# Patient Record
Sex: Male | Born: 1990 | Race: White | Hispanic: No | Marital: Single | Smoking: Never smoker
Health system: Southern US, Community
[De-identification: ages and names within clinical notes are randomized; demographics above are authoritative.]

## PROBLEM LIST (undated history)

## (undated) DIAGNOSIS — F0781 Postconcussional syndrome: Secondary | ICD-10-CM

## (undated) DIAGNOSIS — F419 Anxiety disorder, unspecified: Secondary | ICD-10-CM

## (undated) DIAGNOSIS — F32A Depression, unspecified: Secondary | ICD-10-CM

## (undated) HISTORY — DX: Postconcussional syndrome: F07.81

## (undated) HISTORY — DX: Anxiety disorder, unspecified: F41.9

## (undated) HISTORY — DX: Depression, unspecified: F32.A

## (undated) HISTORY — PX: WISDOM TOOTH EXTRACTION: SHX21

---

## 2018-01-06 ENCOUNTER — Ambulatory Visit: Payer: Self-pay | Admitting: Osteopathic Medicine

## 2018-01-06 ENCOUNTER — Telehealth: Payer: Self-pay | Admitting: Osteopathic Medicine

## 2018-01-06 NOTE — Telephone Encounter (Signed)
No-show to establish care with Dr. Lyn HollingsheadAlexander, 01/06/18. If late or no-show again, will not accept patient to this clinic. Please call him to reschedule visit and inform them of this policy.

## 2018-01-07 NOTE — Telephone Encounter (Signed)
Printed for scheduler

## 2019-08-08 ENCOUNTER — Ambulatory Visit (INDEPENDENT_AMBULATORY_CARE_PROVIDER_SITE_OTHER): Payer: Self-pay | Admitting: Otolaryngology

## 2019-08-15 ENCOUNTER — Encounter (INDEPENDENT_AMBULATORY_CARE_PROVIDER_SITE_OTHER): Payer: Self-pay | Admitting: Otolaryngology

## 2019-08-15 ENCOUNTER — Ambulatory Visit (INDEPENDENT_AMBULATORY_CARE_PROVIDER_SITE_OTHER): Payer: 59 | Admitting: Otolaryngology

## 2019-08-15 ENCOUNTER — Other Ambulatory Visit: Payer: Self-pay

## 2019-08-15 VITALS — Temp 97.9°F

## 2019-08-15 DIAGNOSIS — J342 Deviated nasal septum: Secondary | ICD-10-CM | POA: Diagnosis not present

## 2019-08-15 DIAGNOSIS — J310 Chronic rhinitis: Secondary | ICD-10-CM

## 2019-08-15 NOTE — Progress Notes (Signed)
HPI: Mason Cabrera is a 29 y.o. male who presents for evaluation of nasal congestion and postnasal drainage.  He complains of a lot of mucus especially in the mornings.  He has to cough frequently because of the excessive mucus.  He was told years ago that he has a septal deviation that is contributing to his nasal obstruction.  He has been using Flonase. Complains mostly of postnasal drainage and cough.  He has nasal congestion which is worse on the left side.  He does have history of allergies. He takes medications for anxiety and depression but is otherwise healthy. He has a questionable allergy to Augmentin as a child.  No past medical history on file.  Social History   Socioeconomic History  . Marital status: Single    Spouse name: Not on file  . Number of children: Not on file  . Years of education: Not on file  . Highest education level: Not on file  Occupational History  . Not on file  Tobacco Use  . Smoking status: Never Smoker  . Smokeless tobacco: Never Used  Substance and Sexual Activity  . Alcohol use: Not on file  . Drug use: Not on file  . Sexual activity: Not on file  Other Topics Concern  . Not on file  Social History Narrative  . Not on file   Social Determinants of Health   Financial Resource Strain:   . Difficulty of Paying Living Expenses: Not on file  Food Insecurity:   . Worried About Programme researcher, broadcasting/film/video in the Last Year: Not on file  . Ran Out of Food in the Last Year: Not on file  Transportation Needs:   . Lack of Transportation (Medical): Not on file  . Lack of Transportation (Non-Medical): Not on file  Physical Activity:   . Days of Exercise per Week: Not on file  . Minutes of Exercise per Session: Not on file  Stress:   . Feeling of Stress : Not on file  Social Connections:   . Frequency of Communication with Friends and Family: Not on file  . Frequency of Social Gatherings with Friends and Family: Not on file  . Attends Religious  Services: Not on file  . Active Member of Clubs or Organizations: Not on file  . Attends Banker Meetings: Not on file  . Marital Status: Not on file   No family history on file. Not on File Prior to Admission medications   Not on File     Positive ROS: Otherwise negative  All other systems have been reviewed and were otherwise negative with the exception of those mentioned in the HPI and as above.  Physical Exam: Constitutional: Alert, well-appearing, no acute distress Ears: External ears without lesions or tenderness. Ear canals are clear bilaterally with intact, clear TMs.  Nasal: External nose without lesions. Septum is deviated to the left anteriorly inferiorly..  Moderate rhinitis.  Both middle meatus regions are clear with no signs of infection.  No polyps noted.  Minimal clear mucus within the nasal cavity. Oral: Lips and gums without lesions. Tongue and palate mucosa without lesions. Posterior oropharynx clear.  Indirect laryngoscopy revealed a clear hypopharynx and larynx. Neck: No palpable adenopathy or masses Respiratory: Breathing comfortably.  Lungs clear to auscultation Cardiac exam: Regular rate and rhythm without murmur. Skin: No facial/neck lesions or rash noted.  Procedures  Assessment: Chronic rhinitis Septal deviation with moderate turbinate partially  Plan: I discussed with him that he would  probably benefit from septoplasty and turbinate reductions as far as breathing easier as well as reducing postnasal drainage. In the meantime reviewed with him concerning medical treatment with use of either Flonase or Nasacort and gave a prescription to try Nasacort to compare the Flonase.  Also gave him some samples of Mucinex DM to try as well as regular Mucinex.  Also gave him a sample of Xyzal.  Suggested antihistamines would also help with some postnasal drainage. He will call us back when he decides to schedule septoplasty and TURB reductions.  Radene Journey, MD

## 2020-02-06 DIAGNOSIS — M549 Dorsalgia, unspecified: Secondary | ICD-10-CM | POA: Insufficient documentation

## 2020-02-26 ENCOUNTER — Ambulatory Visit (INDEPENDENT_AMBULATORY_CARE_PROVIDER_SITE_OTHER): Payer: 59 | Admitting: Family Medicine

## 2020-02-26 ENCOUNTER — Encounter: Payer: Self-pay | Admitting: Family Medicine

## 2020-02-26 ENCOUNTER — Other Ambulatory Visit: Payer: Self-pay

## 2020-02-26 VITALS — Ht 67.0 in | Wt 175.0 lb

## 2020-02-26 DIAGNOSIS — F321 Major depressive disorder, single episode, moderate: Secondary | ICD-10-CM | POA: Diagnosis not present

## 2020-02-26 DIAGNOSIS — T796XXA Traumatic ischemia of muscle, initial encounter: Secondary | ICD-10-CM | POA: Diagnosis not present

## 2020-02-26 DIAGNOSIS — R569 Unspecified convulsions: Secondary | ICD-10-CM

## 2020-02-26 DIAGNOSIS — F411 Generalized anxiety disorder: Secondary | ICD-10-CM
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
## 2020-02-26 NOTE — Assessment & Plan Note (Signed)
Management of depression and anxiety per psychiatry.   Stable with current medications.

## 2020-02-26 NOTE — Assessment & Plan Note (Signed)
Questionable seizure causing fall while in the hospital.  Continues on depakene.   He is having some continued memory issues as well, likely related to concussion.  Referral placed to neurology.

## 2020-02-26 NOTE — Progress Notes (Signed)
Symptoms:  Feverish - no fever, feels feverish Cough - 1 day (resolved)
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
## 2020-02-27 LAB — COMPLETE METABOLIC PANEL WITH GFR
AG Ratio: 1.6 (calc) (ref 1.0–2.5)
ALT: 33 U/L (ref 9–46)
AST: 23 U/L (ref 10–40)
Albumin: 3.9 g/dL (ref 3.6–5.1)
Alkaline phosphatase (APISO): 97 U/L (ref 36–130)
BUN: 9 mg/dL (ref 7–25)
CO2: 25 mmol/L (ref 20–32)
Calcium: 8.8 mg/dL (ref 8.6–10.3)
Chloride: 107 mmol/L (ref 98–110)
Creat: 1.19 mg/dL (ref 0.60–1.35)
GFR, Est African American: 94 mL/min/{1.73_m2} (ref >=60)
GFR, Est Non African American: 81 mL/min/{1.73_m2} (ref >=60)
Globulin: 2.4 g/dL (calc) (ref 1.9–3.7)
Glucose, Bld: 120 mg/dL (ref 65–139)
Potassium: 3.7 mmol/L (ref 3.5–5.3)
Sodium: 140 mmol/L (ref 135–146)
Total Bilirubin: 0.3 mg/dL (ref 0.2–1.2)
Total Protein: 6.3 g/dL (ref 6.1–8.1)

## 2020-02-27 LAB — CBC
HCT: 43.5 % (ref 38.5–50.0)
Hemoglobin: 15 g/dL (ref 13.2–17.1)
MCH: 31.7 pg (ref 27.0–33.0)
MCHC: 34.5 g/dL (ref 32.0–36.0)
MCV: 92 fL (ref 80.0–100.0)
MPV: 10 fL (ref 7.5–12.5)
Platelets: 387 10*3/uL (ref 140–400)
RBC: 4.73 10*6/uL (ref 4.20–5.80)
RDW: 12.7 % (ref 11.0–15.0)
WBC: 9.6 10*3/uL (ref 3.8–10.8)

## 2020-02-27 LAB — CK: Total CK: 76 U/L (ref 44–196)

## 2020-03-01 ENCOUNTER — Other Ambulatory Visit: Payer: Self-pay | Admitting: Family Medicine

## 2020-03-01 ENCOUNTER — Encounter: Payer: Self-pay | Admitting: Family Medicine

## 2020-03-05 ENCOUNTER — Encounter: Payer: Self-pay | Admitting: Neurology

## 2020-03-05 ENCOUNTER — Other Ambulatory Visit: Payer: Self-pay

## 2020-03-05 ENCOUNTER — Ambulatory Visit (INDEPENDENT_AMBULATORY_CARE_PROVIDER_SITE_OTHER): Payer: 59 | Admitting: Neurology

## 2020-03-05 VITALS — BP 122/76 | HR 79 | Ht 67.0 in | Wt 187.0 lb

## 2020-03-05 DIAGNOSIS — S069XAA Unspecified intracranial injury with loss of consciousness status unknown, initial encounter: Secondary | ICD-10-CM | POA: Insufficient documentation

## 2020-03-05 DIAGNOSIS — S069X1A Unspecified intracranial injury with loss of consciousness of 30 minutes or less, initial encounter: Secondary | ICD-10-CM | POA: Diagnosis not present

## 2020-03-05 DIAGNOSIS — T796XXA Traumatic ischemia of muscle, initial encounter: Secondary | ICD-10-CM | POA: Diagnosis not present

## 2020-03-05 DIAGNOSIS — F609 Personality disorder, unspecified: Secondary | ICD-10-CM | POA: Diagnosis not present

## 2020-03-05 DIAGNOSIS — R412 Retrograde amnesia: Secondary | ICD-10-CM | POA: Diagnosis not present

## 2020-03-05 DIAGNOSIS — F0781 Postconcussional syndrome: Secondary | ICD-10-CM
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
## 2020-03-05 NOTE — Patient Instructions (Signed)
Traumatic Brain Injury Traumatic brain injury (TBI) is an injury to the brain that results from:  A hard, direct blow to the head (closed injury).  An object penetrating the skull and entering the brain (open injury). Traumatic brain injury is also called a head injury or a concussion. TBI can be mild, moderate, or severe. What are the causes? Common causes of this condition include:  Falls.  Motor vehicle accidents.  Sports injuries.  Assaults. What increases the risk? You are more likely to develop this condition if you:  Are 75 years old or older.  Are a man.  Play contact sports, especially football, hockey, or soccer.  Are in the military.  Are a victim of violence.  Abuse drugs or alcohol.  Have had a previous TBI. What are the signs or symptoms? Symptoms may vary from person to person, and may include:  Loss of consciousness.  Headache.  Confusion.  Fatigue.  Changes in sleep.  Dizziness.  Mood or personality changes.  Memory problems.  Nausea or vomiting or both.  Seizures.  Clumsiness.  Slurred speech.  Depression and anxiety.  Anger.  Trouble concentrating, organizing, or making decisions.  Inability to control emotions or actions (impulse control).  Loss of or dulling of the senses, such as hearing, vision, and touch. This can include: ? Blurred vision. ? Ringing in your ears. How is this diagnosed? This condition may be diagnosed based on:  Medical history and physical exam.  Neurologic exam. This checks for brain and nervous system function, including your reflexes, memory, and coordination.  CT scan. Your TBI may be described as mild, moderate, or severe. How is this treated? Treatment depends on the severity of your brain injury and may include:  Breathing support (mechanical ventilation).  Blood pressure medicines.  Pain medicines.  Treatments to decrease the swelling in your brain.  Brain surgery. This may be  needed to: ? Remove a blood clot. ? Repair bleeding. ? Remove an object that has penetrated the brain, such as a skull fragment or a bullet. Treatment of TBI also includes:  Physical and mental rest.  Careful observation.  Medicine. You may be prescribed medicines to help with symptoms such as headaches, nausea, or difficulty sleeping.  Physical, occupational, and speech therapy.  Referral to a concussion clinic or rehabilitation center. Follow these instructions at home: Medicines  Take over-the-counter and prescription medicines only as told by your health care provider.  Do not take blood thinners (anticoagulants), aspirin, or other anti-inflammatory medicines such as ibuprofen or naproxen unless approved by your health care provider. Activity  Rest. Rest helps the brain to heal. Make sure you: ? Get plenty of sleep. Most adults should get 7-9 hours of sleep each night. ? Rest during the day. Take daytime naps or rest breaks when you feel tired.  Do not do high-risk activities that could cause a second concussion, such as riding a bike or playing sports. Having another concussion before the first one has healed can be dangerous.  Avoid a lot of visual stimulation. This includes work on the computer or phone, watching TV, and reading.  Ask your health care provider what kind of activities are safe for you. Your ability to react may be slower after a brain injury. Never do these activities if you are dizzy. Your health care provider will likely give you a plan for gradually returning to activities. General instructions  Do not drink alcohol.  Watch your symptoms and tell others to do the   same. Complications sometimes occur after a brain injury. Older adults with a brain injury may have a higher risk of serious complications.  Seek support from friends and family.  Keep all follow-up visits as directed by your health care provider. This is important. Contact a health care  provider if:  Your symptoms get worse or they do not improve.  You have new symptoms.  You have another injury. Get help right away if:  You have: ? Severe, persistent headaches that are not relieved by medicine. ? Weakness or numbness in any part of your body. ? Confusion. ? Slurred speech. ? Difficulty waking up. ? Nausea or persistent vomiting. ? A feeling like you are moving when you are not (vertigo). ? Seizures or you faint. ? Changes in your vision. ? Clear or bloody discharge from your nose or ears.  You cannot use your arms or legs normally. Summary  Traumatic brain injury happens when there is a hard, direct blow to the head or when an object penetrates the skull and enters the brain.  Traumatic brain injuries may be mild, moderate, or severe. Treatment depends on the severity of your injury.  Get help right away if you have a head injury and you develop seizures, confusion, vomiting, weakness in the arms or legs, slurred speech, and other symptoms.  Rest is one of the best treatments. Do not return to activity until your health care provider approves. This information is not intended to replace advice given to you by your health care provider. Make sure you discuss any questions you have with your health care provider. Document Revised: 09/29/2017 Document Reviewed: 07/12/2017 Elsevier Patient Education  2020 [employer]vier Inc[employer]  

## 2020-03-06 ENCOUNTER — Encounter: Payer: Self-pay | Admitting: Family Medicine

## 2020-03-06 DIAGNOSIS — M545 Low back pain, unspecified: Secondary | ICD-10-CM

## 2020-03-06 NOTE — Telephone Encounter (Signed)
Orthopedics referral pended

## 2020-03-07 NOTE — Telephone Encounter (Signed)
Referral completed

## 2020-03-11 ENCOUNTER — Ambulatory Visit (INDEPENDENT_AMBULATORY_CARE_PROVIDER_SITE_OTHER): Payer: 59 | Admitting: Family Medicine

## 2020-03-11 ENCOUNTER — Other Ambulatory Visit: Payer: Self-pay

## 2020-03-11 ENCOUNTER — Encounter: Payer: Self-pay | Admitting: Family Medicine

## 2020-03-11 DIAGNOSIS — M545 Low back pain, unspecified: Secondary | ICD-10-CM | POA: Diagnosis not present

## 2020-03-11 DIAGNOSIS — S060X9S Concussion with loss of consciousness of unspecified duration, sequela: Secondary | ICD-10-CM | POA: Diagnosis not present

## 2020-03-11 MED ORDER — BACLOFEN 10 MG PO TABS: 5.0000 mg | ORAL_TABLET | Freq: Three times a day (TID) | ORAL | 3 refills | Status: DC | PRN
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
## 2020-03-13 ENCOUNTER — Encounter: Payer: Self-pay | Admitting: Family Medicine

## 2020-03-16 MED ORDER — TIZANIDINE HCL 2 MG PO TABS: 2.0000 mg | ORAL_TABLET | Freq: Four times a day (QID) | ORAL | 1 refills | Status: DC | PRN

## 2020-03-16 NOTE — Addendum Note (Signed)
Addended by: Lillia Carmel on: 03/16/2020 08:07 PM   Modules accepted: Orders

## 2020-03-19 ENCOUNTER — Encounter: Payer: Self-pay | Admitting: Family Medicine

## 2020-03-19 DIAGNOSIS — M545 Low back pain, unspecified: Secondary | ICD-10-CM

## 2020-04-03 ENCOUNTER — Encounter: Payer: Self-pay | Admitting: Family Medicine

## 2020-04-03 DIAGNOSIS — M545 Low back pain, unspecified: Secondary | ICD-10-CM

## 2020-04-08 ENCOUNTER — Encounter: Payer: 59 | Admitting: Family Medicine

## 2020-04-08 ENCOUNTER — Other Ambulatory Visit: Payer: Self-pay | Admitting: Family Medicine

## 2020-04-25 ENCOUNTER — Other Ambulatory Visit: Payer: 59

## 2020-05-03 ENCOUNTER — Other Ambulatory Visit: Payer: Self-pay | Admitting: Family Medicine

## 2020-06-05 ENCOUNTER — Ambulatory Visit
Admission: RE | Admit: 2020-06-05 | Discharge: 2020-06-05 | Disposition: A | Discharge status: Home / Self Care | Payer: 59 | Source: Ambulatory Visit | Attending: Family Medicine | Admitting: Family Medicine

## 2020-06-05 ENCOUNTER — Other Ambulatory Visit: Payer: Self-pay

## 2020-06-05 DIAGNOSIS — M545 Low back pain, unspecified: Secondary | ICD-10-CM

## 2020-06-06 ENCOUNTER — Telehealth: Payer: Self-pay | Admitting: Family Medicine

## 2020-06-06 NOTE — Telephone Encounter (Signed)
Lumbar MRI looks normal.   

## 2020-06-12 ENCOUNTER — Ambulatory Visit (INDEPENDENT_AMBULATORY_CARE_PROVIDER_SITE_OTHER): Payer: 59 | Admitting: Family Medicine

## 2020-06-12 DIAGNOSIS — M545 Low back pain, unspecified: Secondary | ICD-10-CM

## 2020-06-12 NOTE — Progress Notes (Signed)
I saw and examined the patient with Dr. Marga Hoots and agree with assessment and plan as outlined.    Pain is better with PT, but not where he needs to be.   Exam reveals tender trigger points in the upper lumbar paraspinals today.  Will refer to Dr. Berline Chough for OMT treatments.  If not improved with that, then dry needling in PT.
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
## 2020-06-12 NOTE — Patient Instructions (Signed)
    Gaspar Bidding, DO  [email or web address]  ADDRESS [address] [place], [place] [address] CONTACT INFO [email or web address] [phone]

## 2020-07-10 ENCOUNTER — Other Ambulatory Visit: Payer: Self-pay | Admitting: Family Medicine

## 2020-08-23 ENCOUNTER — Ambulatory Visit (INDEPENDENT_AMBULATORY_CARE_PROVIDER_SITE_OTHER): Payer: 59 | Admitting: Family Medicine

## 2020-08-23 ENCOUNTER — Other Ambulatory Visit: Payer: Self-pay

## 2020-08-23 ENCOUNTER — Encounter: Payer: Self-pay | Admitting: Family Medicine

## 2020-08-23 VITALS — BP 118/66 | HR 61 | Temp 97.5°F | Wt 192.5 lb

## 2020-08-23 DIAGNOSIS — R630 Anorexia: Secondary | ICD-10-CM | POA: Diagnosis not present

## 2020-08-23 DIAGNOSIS — R5383 Other fatigue: Secondary | ICD-10-CM | POA: Diagnosis not present

## 2020-08-23 MED ORDER — TRIAMCINOLONE ACETONIDE 55 MCG/ACT NA AERO: INHALATION_SPRAY | NASAL | 3 refills | Status: DC

## 2020-08-25 DIAGNOSIS — R5383 Other fatigue: Secondary | ICD-10-CM | POA: Insufficient documentation
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
## 2020-08-25 NOTE — Assessment & Plan Note (Addendum)
Based on previous symptoms the most likely cause is post viral fatigue.  I have ordered additional labs for evaluation of fatigue as well.  He denies any recent medication changes. Orders Placed This Encounter  Procedures  . COMPLETE METABOLIC PANEL WITH GFR  . CBC with Differential  . TSH  . B12  . HIV antibody (with reflex)

## 2020-08-27 LAB — COMPLETE METABOLIC PANEL WITH GFR
AG Ratio: 1.8 (calc) (ref 1.0–2.5)
ALT: 26 U/L (ref 9–46)
AST: 18 U/L (ref 10–40)
Albumin: 4.6 g/dL (ref 3.6–5.1)
Alkaline phosphatase (APISO): 74 U/L (ref 36–130)
BUN: 15 mg/dL (ref 7–25)
CO2: 22 mmol/L (ref 20–32)
Calcium: 9.8 mg/dL (ref 8.6–10.3)
Chloride: 106 mmol/L (ref 98–110)
Creat: 1.27 mg/dL (ref 0.60–1.35)
GFR, Est African American: 86 mL/min/{1.73_m2} (ref >=60)
GFR, Est Non African American: 74 mL/min/{1.73_m2} (ref >=60)
Globulin: 2.6 g/dL (calc) (ref 1.9–3.7)
Glucose, Bld: 95 mg/dL (ref 65–139)
Potassium: 4.1 mmol/L (ref 3.5–5.3)
Sodium: 138 mmol/L (ref 135–146)
Total Bilirubin: 0.8 mg/dL (ref 0.2–1.2)
Total Protein: 7.2 g/dL (ref 6.1–8.1)

## 2020-08-27 LAB — CBC WITH DIFFERENTIAL/PLATELET
Absolute Monocytes: 679 cells/uL (ref 200–950)
Basophils Absolute: 52 cells/uL (ref 0–200)
Basophils Relative: 0.6 %
Eosinophils Absolute: 172 cells/uL (ref 15–500)
Eosinophils Relative: 2 %
HCT: 44.8 % (ref 38.5–50.0)
Hemoglobin: 15.6 g/dL (ref 13.2–17.1)
Lymphs Abs: 2348 cells/uL (ref 850–3900)
MCH: 31.1 pg (ref 27.0–33.0)
MCHC: 34.8 g/dL (ref 32.0–36.0)
MCV: 89.4 fL (ref 80.0–100.0)
MPV: 9.8 fL (ref 7.5–12.5)
Monocytes Relative: 7.9 %
Neutro Abs: 5349 cells/uL (ref 1500–7800)
Neutrophils Relative %: 62.2 %
Platelets: 341 10*3/uL (ref 140–400)
RBC: 5.01 10*6/uL (ref 4.20–5.80)
RDW: 13.1 % (ref 11.0–15.0)
Total Lymphocyte: 27.3 %
WBC: 8.6 10*3/uL (ref 3.8–10.8)

## 2020-08-27 LAB — VITAMIN B12: Vitamin B-12: 743 pg/mL (ref 200–1100)

## 2020-08-27 LAB — HIV ANTIBODY (ROUTINE TESTING W REFLEX): HIV 1&2 Ab, 4th Generation: NONREACTIVE

## 2020-08-27 LAB — TSH: TSH: 1.18 mIU/L (ref 0.40–4.50)

## 2021-01-14 ENCOUNTER — Other Ambulatory Visit: Payer: Self-pay | Admitting: Family Medicine

## 2021-02-06 ENCOUNTER — Other Ambulatory Visit: Payer: Self-pay

## 2021-02-06 ENCOUNTER — Ambulatory Visit (INDEPENDENT_AMBULATORY_CARE_PROVIDER_SITE_OTHER): Payer: 59 | Admitting: Family Medicine

## 2021-02-06 ENCOUNTER — Encounter: Payer: Self-pay | Admitting: Family Medicine

## 2021-02-06 VITALS — BP 127/77 | HR 58 | Temp 97.7°F | Ht 67.0 in | Wt 192.9 lb

## 2021-02-06 DIAGNOSIS — F321 Major depressive disorder, single episode, moderate: Secondary | ICD-10-CM | POA: Diagnosis not present

## 2021-02-06 DIAGNOSIS — R5383 Other fatigue: Secondary | ICD-10-CM

## 2021-02-06 DIAGNOSIS — Z23 Encounter for immunization: Secondary | ICD-10-CM

## 2021-02-06 NOTE — Assessment & Plan Note (Signed)
Rechecking renal function today.  This had improved on check last year.

## 2021-02-06 NOTE — Assessment & Plan Note (Signed)
Managed by psychiatry, stable at this time.
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
## 2021-03-10 ENCOUNTER — Other Ambulatory Visit: Payer: Self-pay | Admitting: Family Medicine

## 2021-04-08 DEATH — deceased

## 2022-01-07 IMAGING — MR MR LUMBAR SPINE W/O CM
4 of 5 series · 27 of 48 positions shown · non-contrast
Comparison: None.

CLINICAL DATA: Low back pain.

EXAM:
MRI LUMBAR SPINE WITHOUT CONTRAST
TECHNIQUE: Multiplanar, multisequence MR imaging of the lumbar spine was
performed. No intravenous contrast was administered.

[Series 3: T2 post-contrast · sagittal · 4.0mm · 0.53mm/px · 6 of 16 slices shown]
[im 1/16]
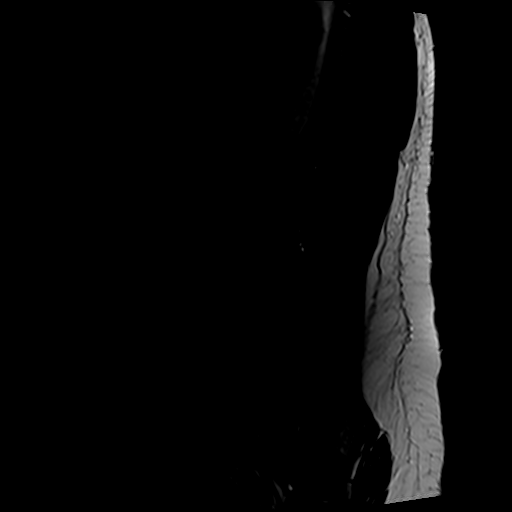
[im 4/16]
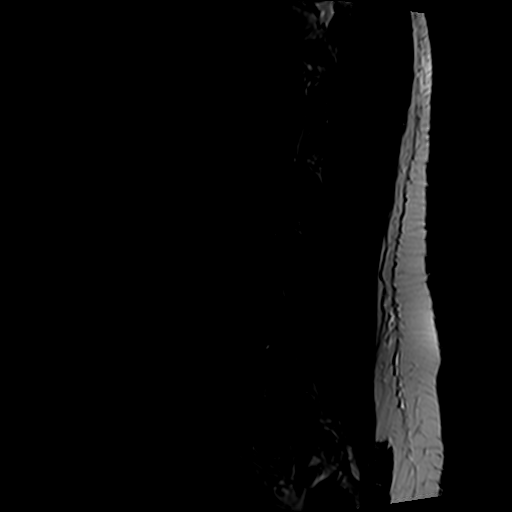
[im 7/16]
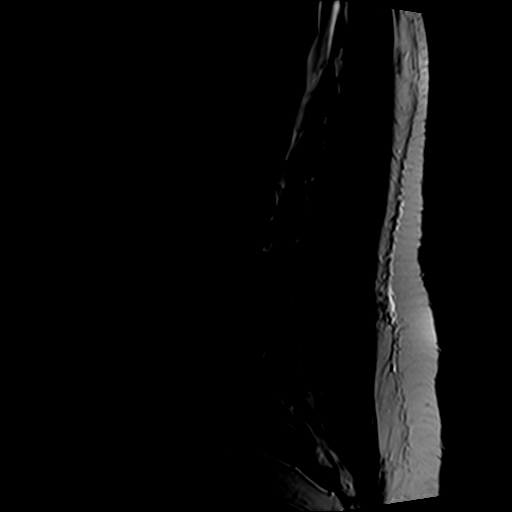
[im 10/16]
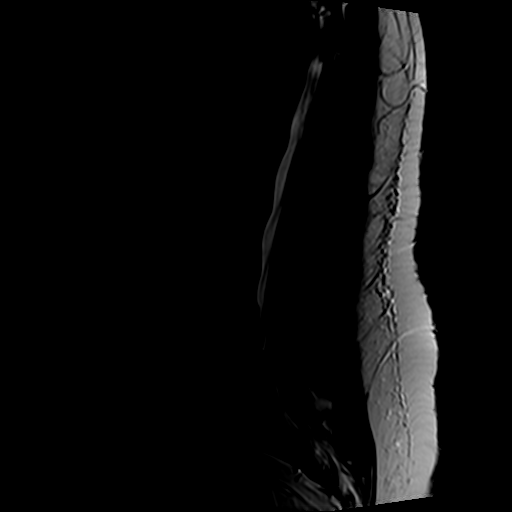
[im 13/16]
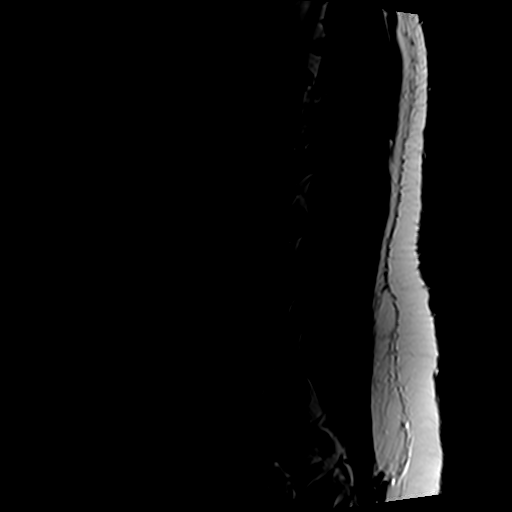
[im 16/16]
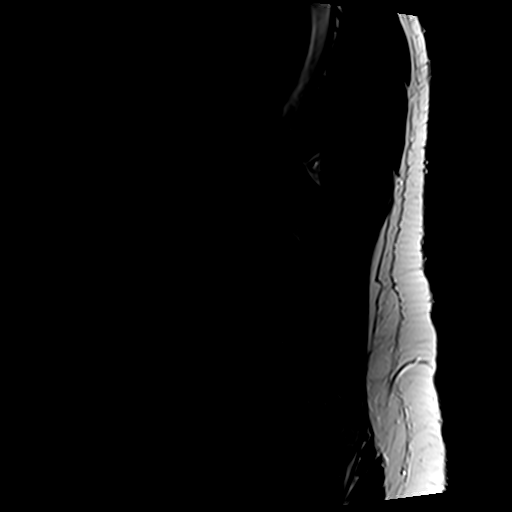

[Series 5: T1 · sagittal · 4.0mm · 0.53mm/px · 6 of 16 slices shown (1 of 2)]
[im 1/16]
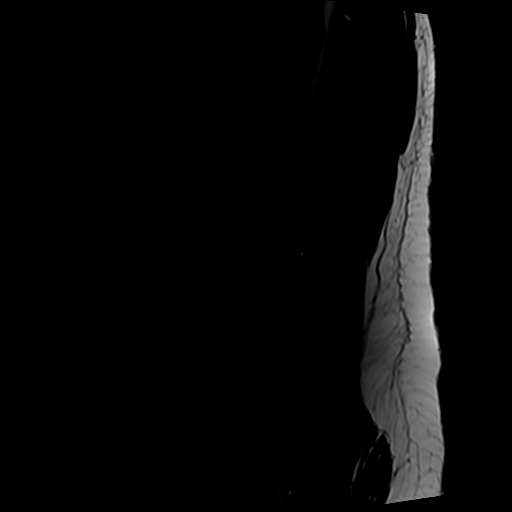
[im 4/16]
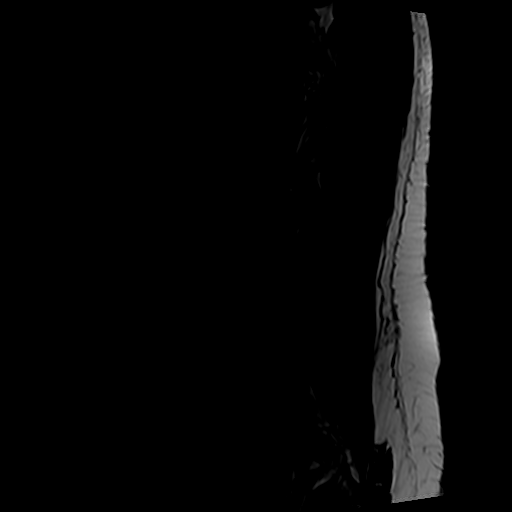
[im 7/16]
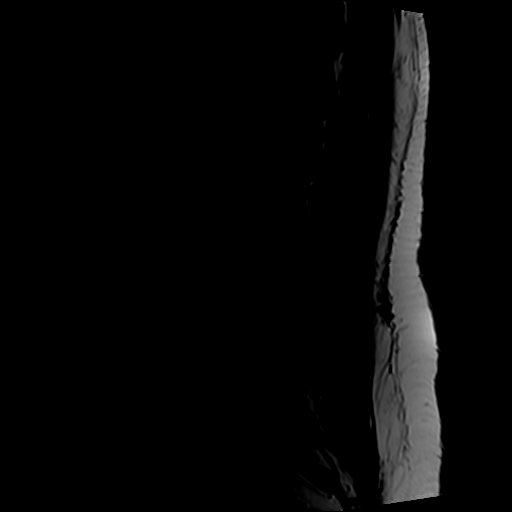
[im 10/16]
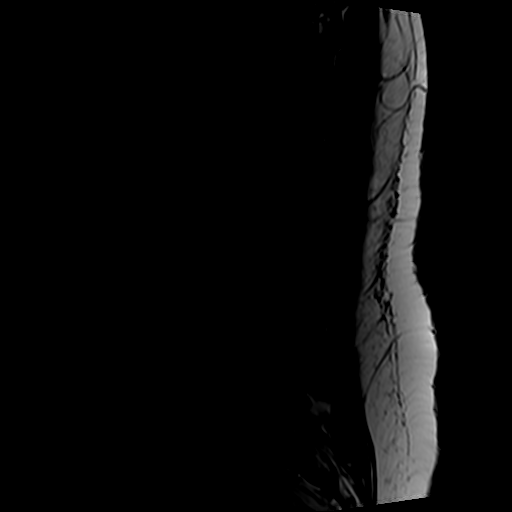
[im 13/16]
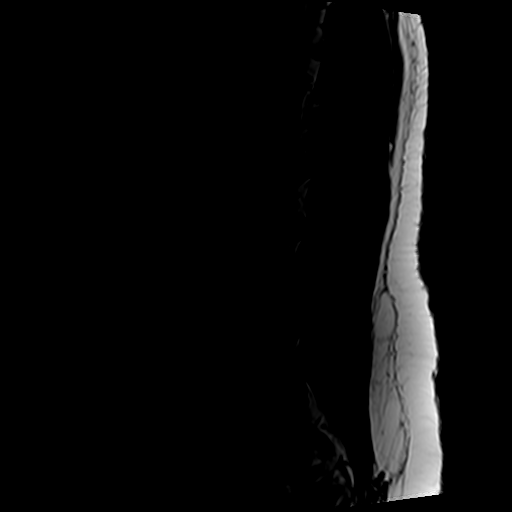
[im 16/16]
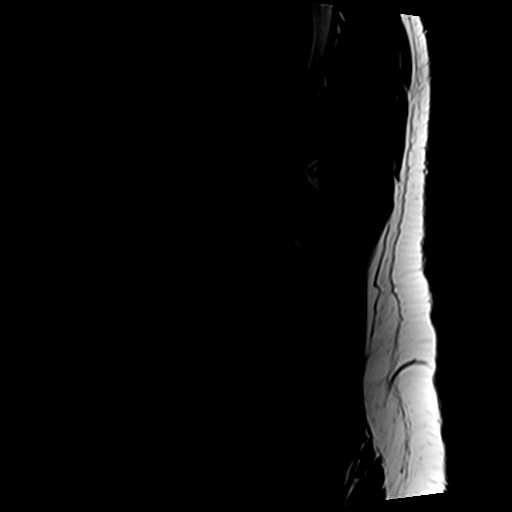

[Series 6: T2 · axial · 4.0mm · 0.70mm/px · z∈[-82,+134]mm · 9 of 37 slices shown]
[im 1/37]
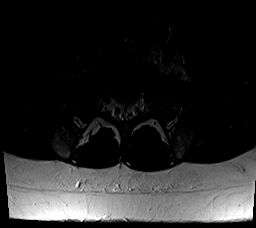
[im 6/37]
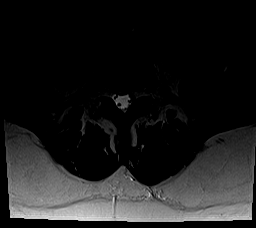
[im 11/37]
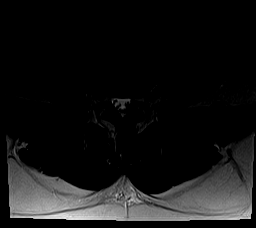
[im 16/37]
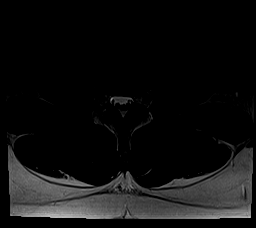
[im 19/37]
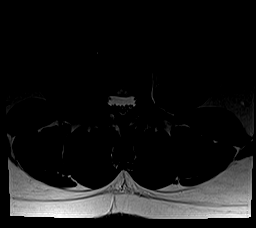
[im 21/37]
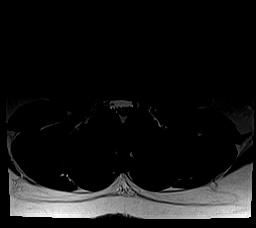
[im 26/37]
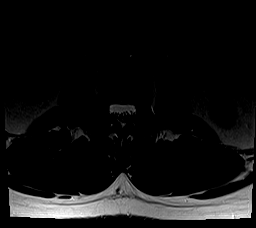
[im 31/37]
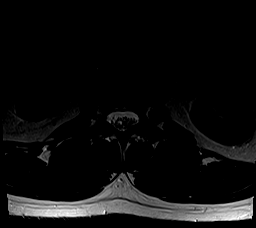
[im 37/37]
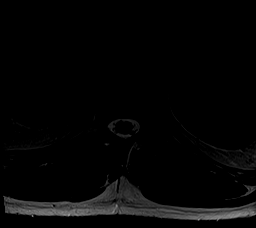

[Series 7: T1 · axial · 4.0mm · 0.35mm/px · z∈[-82,+103]mm · 6 of 37 slices shown (2 of 2)]
[im 1/37]
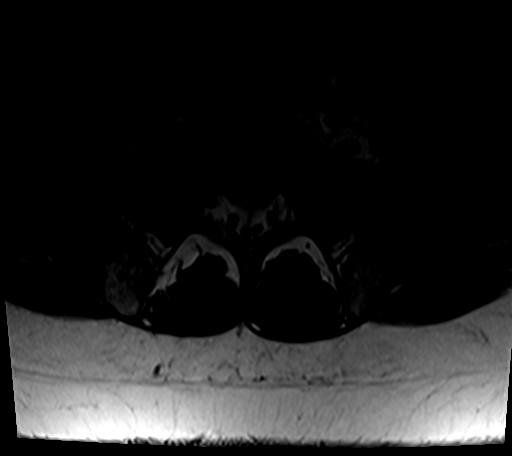
[im 6/37]
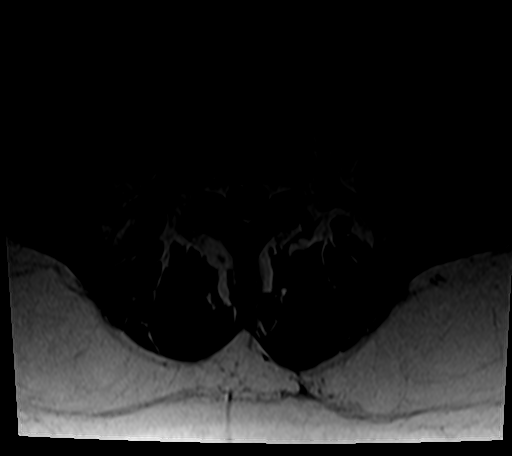
[im 11/37]
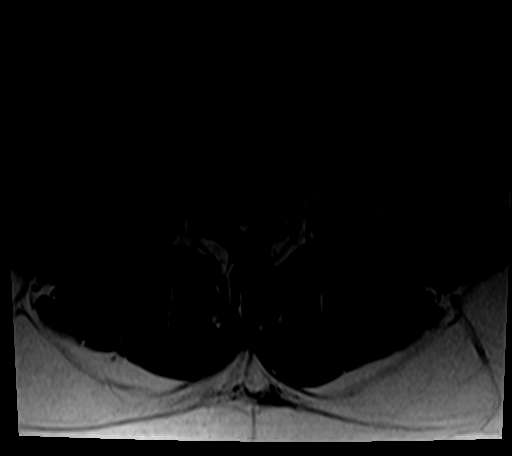
[im 16/37]
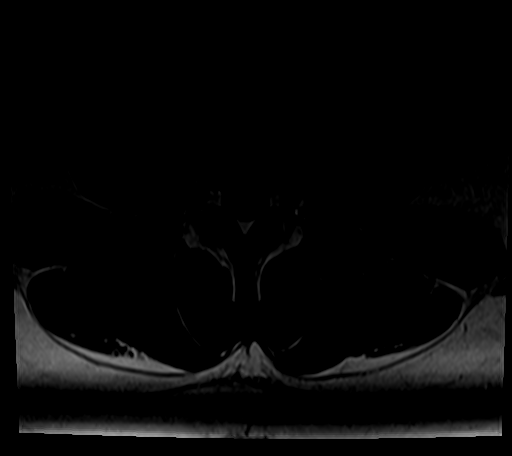
[im 19/37]
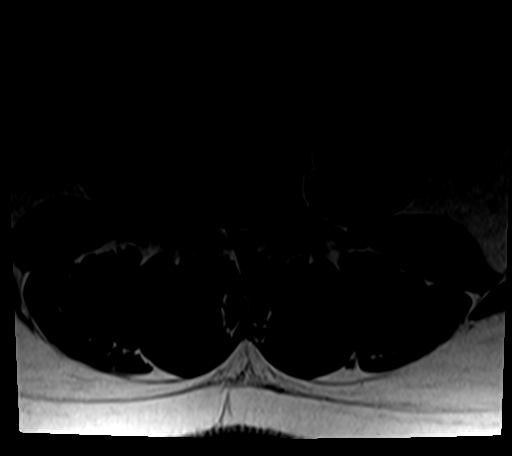
[im 31/37]
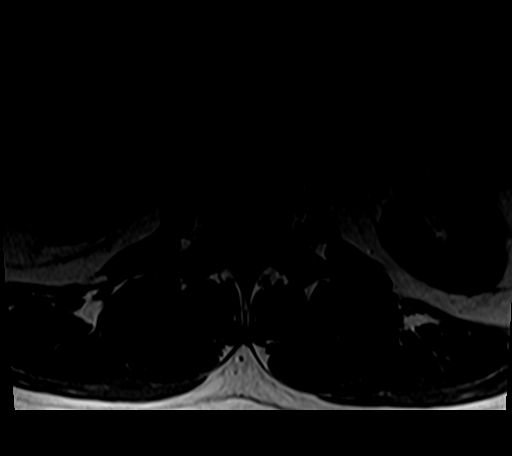

[27 of 48 positions shown; findings below may reference images not displayed]

FINDINGS: Segmentation:  Standard.

Alignment:  Physiologic.

Vertebrae:  No fracture, evidence of discitis, or bone lesion.

Conus medullaris and cauda equina: Conus extends to the L1 level.
Conus and cauda equina appear normal.

Paraspinal and other soft tissues: Negative.

Disc levels:

No significant disc bulge or herniation, spinal canal or neural
foraminal stenosis at any level.
IMPRESSION: Unremarkable MRI of the lumbar spine.
